# Patient Record
Sex: Female | Born: 1995 | Race: White | Hispanic: No | Marital: Single | Smoking: Never smoker
Health system: Southern US, Community
[De-identification: ages and names within clinical notes are randomized; demographics above are authoritative.]

---

## 2016-04-26 ENCOUNTER — Emergency Department
Admission: EM | Admit: 2016-04-26 | Discharge: 2016-04-26 | Disposition: A | Discharge status: Home / Self Care | Payer: Managed Care, Other (non HMO) | Attending: Emergency Medicine | Admitting: Emergency Medicine

## 2016-04-26 ENCOUNTER — Emergency Department: Payer: Managed Care, Other (non HMO)

## 2016-04-26 DIAGNOSIS — Y929 Unspecified place or not applicable: Secondary | ICD-10-CM | POA: Diagnosis not present

## 2016-04-26 DIAGNOSIS — Y999 Unspecified external cause status: Secondary | ICD-10-CM | POA: Diagnosis not present

## 2016-04-26 DIAGNOSIS — Y939 Activity, unspecified: Secondary | ICD-10-CM | POA: Diagnosis not present

## 2016-04-26 DIAGNOSIS — S99911A Unspecified injury of right ankle, initial encounter: Secondary | ICD-10-CM | POA: Diagnosis present

## 2016-04-26 DIAGNOSIS — S93431A Sprain of tibiofibular ligament of right ankle, initial encounter: Secondary | ICD-10-CM | POA: Diagnosis not present

## 2016-04-26 DIAGNOSIS — W010XXA Fall on same level from slipping, tripping and stumbling without subsequent striking against object, initial encounter: Secondary | ICD-10-CM | POA: Diagnosis not present

## 2016-04-26 DIAGNOSIS — S93491A Sprain of other ligament of right ankle, initial encounter: Secondary | ICD-10-CM

## 2016-04-26 MED ORDER — NAPROXEN 500 MG PO TABS: 500.0000 mg | ORAL_TABLET | Freq: Two times a day (BID) | ORAL | 0 refills | Status: AC

## 2016-04-26 NOTE — ED Notes (Signed)
See triage note  States she fell last Thursday  conts to have pain and swelling to right ankle  Positive pulses

## 2016-04-26 NOTE — ED Triage Notes (Signed)
Pt states she fell down a few steps Thursday continued pain and swelling to right ankle.

## 2016-04-26 NOTE — ED Provider Notes (Signed)
[facility] Emergency Department Provider Note ____________________________________________  Time seen: Approximately 12:47 PM  I have reviewed the triage vital signs and the nursing notes.   HISTORY  Chief Complaint Ankle Pain    HPI Annette Wyatt is a 20 y.o. female who presents to the emergency department for evaluation of right ankle pain. She states that she tripped and fell down a few steps on Thursday.Ankle is still bruised and swollen. She has not taken any medications or applied any type of wrap or splint.  No past medical history on file.  There are no active problems to display for this patient.   No past surgical history on file.  Prior to Admission medications   Medication Sig Start Date End Date Taking? Authorizing Provider  naproxen (NAPROSYN) 500 MG tablet Take 1 tablet (500 mg total) by mouth 2 (two) times daily with a meal. 04/26/16   Chinita Pesterari B Loana Salvaggio, FNP    Allergies Patient has no known allergies.  No family history on file.  Social History Social History  Substance Use Topics  . Smoking status: Not on file  . Smokeless tobacco: Not on file  . Alcohol use Not on file    Review of Systems Constitutional: No recent illness. Cardiovascular: Denies chest pain or palpitations. Respiratory: Denies shortness of breath. Musculoskeletal: Pain in right ankle. Skin: Negative for rash, wound, lesion. Neurological: Negative for focal weakness or numbness.  ____________________________________________   PHYSICAL EXAM:  VITAL SIGNS: ED Triage Vitals  Enc Vitals Group     BP 04/26/16 1239 (!) 134/92     Pulse Rate 04/26/16 1239 84     Resp 04/26/16 1239 20     Temp 04/26/16 1239 98.2 F (36.8 C)     Temp Source 04/26/16 1239 Oral     SpO2 04/26/16 1239 98 %     Weight 04/26/16 1237 145 lb (65.8 kg)     Height 04/26/16 1237 5\' 8"  (1.727 m)     Head Circumference --      Peak Flow --      Pain Score 04/26/16 1237 8      Pain Loc --      Pain Edu? --      Excl. in GC? --     Constitutional: Alert and oriented. Well appearing and in no acute distress. Eyes: Conjunctivae are normal. EOMI. Head: Atraumatic. Neck: No stridor.  Respiratory: Normal respiratory effort.   Musculoskeletal: Right ankle swollen and tender in the ATFL pattern. [place] ankle rules are negative. Neurologic:  Normal speech and language. No gross focal neurologic deficits are appreciated. Speech is normal. No gait instability. Skin:  Skin is warm, dry and intact. Ecchymosis noted on the lateral aspect of the right ankle over the lateral malleolus. Psychiatric: Mood and affect are normal. Speech and behavior are normal.  ____________________________________________   LABS (all labs ordered are listed, but only abnormal results are displayed)  Labs Reviewed - No data to display ____________________________________________  RADIOLOGY  Right ankle negative for acute bony abnormality per radiology. I, Kem Boroughsari Lydia Toren, personally viewed and evaluated these images (plain radiographs) as part of my medical decision making, as well as reviewing the written report by the radiologist.   ____________________________________________   PROCEDURES  Procedure(s) performed: Ankle stirrup splint applied by ER tech. Patient neurovascularly intact post application. Cruches given and training completed.    ____________________________________________   INITIAL IMPRESSION / ASSESSMENT AND PLAN / ED COURSE  Clinical Course     Pertinent  labs & imaging results that were available during my care of the patient were reviewed by me and considered in my medical decision making (see chart for details).  Patient advised to follow up with podiatry for symptoms that are not improving over the week. He was advised to return to the ER for symptoms that change or worsen if unable to see the  specialist. ____________________________________________   FINAL CLINICAL IMPRESSION(S) / ED DIAGNOSES  Final diagnoses:  Sprain of anterior talofibular ligament of right ankle, initial encounter       Chinita PesterCari B Nuri Larmer, FNP 04/26/16 1417    Jene Everyobert Kinner, MD 04/27/16 1704

## 2016-04-26 NOTE — Discharge Instructions (Signed)
Follow up with Dr. Ether GriffinsFowler for symptoms that do not improve over the week.

## 2016-06-05 ENCOUNTER — Emergency Department
Admission: EM | Admit: 2016-06-05 | Discharge: 2016-06-05 | Disposition: A | Discharge status: Home / Self Care | Payer: Managed Care, Other (non HMO) | Attending: Emergency Medicine | Admitting: Emergency Medicine

## 2016-06-05 ENCOUNTER — Encounter: Payer: Self-pay | Admitting: Emergency Medicine

## 2016-06-05 DIAGNOSIS — N939 Abnormal uterine and vaginal bleeding, unspecified: Secondary | ICD-10-CM

## 2016-06-05 DIAGNOSIS — Z791 Long term (current) use of non-steroidal anti-inflammatories (NSAID): Secondary | ICD-10-CM | POA: Diagnosis not present

## 2016-06-05 LAB — PREGNANCY, URINE: Preg Test, Ur: NEGATIVE

## 2016-06-05 LAB — CBC
HCT: 38.7 % (ref 35.0–47.0)
Hemoglobin: 13.5 g/dL (ref 12.0–16.0)
MCH: 30 pg (ref 26.0–34.0)
MCHC: 34.9 g/dL (ref 32.0–36.0)
MCV: 86 fL (ref 80.0–100.0)
PLATELETS: 259 10*3/uL (ref 150–440)
RBC: 4.51 MIL/uL (ref 3.80–5.20)
RDW: 13.2 % (ref 11.5–14.5)
WBC: 4.8 10*3/uL (ref 3.6–11.0)

## 2016-06-05 LAB — URINALYSIS, COMPLETE (UACMP) WITH MICROSCOPIC
BILIRUBIN URINE: NEGATIVE
Bacteria, UA: NONE SEEN
GLUCOSE, UA: NEGATIVE mg/dL
Hgb urine dipstick: NEGATIVE
KETONES UR: 5 mg/dL — AB
LEUKOCYTES UA: NEGATIVE
Nitrite: NEGATIVE
PH: 8 (ref 5.0–8.0)
Protein, ur: 30 mg/dL — AB
Specific Gravity, Urine: 1.023 (ref 1.005–1.030)

## 2016-06-05 LAB — COMPREHENSIVE METABOLIC PANEL
ALK PHOS: 65 U/L (ref 38–126)
ALT: 28 U/L (ref 14–54)
AST: 33 U/L (ref 15–41)
Albumin: 4.7 g/dL (ref 3.5–5.0)
Anion gap: 8 (ref 5–15)
BUN: 11 mg/dL (ref 6–20)
CALCIUM: 9.4 mg/dL (ref 8.9–10.3)
CO2: 26 mmol/L (ref 22–32)
CREATININE: 0.73 mg/dL (ref 0.44–1.00)
Chloride: 105 mmol/L (ref 101–111)
Glucose, Bld: 96 mg/dL (ref 65–99)
Potassium: 3.4 mmol/L — ABNORMAL LOW (ref 3.5–5.1)
Sodium: 139 mmol/L (ref 135–145)
Total Bilirubin: 0.7 mg/dL (ref 0.3–1.2)
Total Protein: 8.4 g/dL — ABNORMAL HIGH (ref 6.5–8.1)

## 2016-06-05 LAB — LIPASE, BLOOD: Lipase: 23 U/L (ref 11–51)

## 2016-06-05 NOTE — ED Triage Notes (Signed)
Pt to ed with c/o vaginal bleeding x 2 months.  Pt also reports weakness and Intermittent vomiting.

## 2016-06-05 NOTE — Discharge Instructions (Signed)
Please call and schedule an appointment at [facility] for the first available appointment that works with your schedule.

## 2016-06-05 NOTE — ED Provider Notes (Signed)
[facility] Emergency Department Provider Note   ____________________________________________    I have reviewed the triage vital signs and the nursing notes.   HISTORY  Chief Complaint Vaginal Bleeding     HPI Annette Wyatt is a 21 y.o. female who presents with 2 months of vaginal bleeding. Patient reports she has essentially been bleeding nearly daily, she denies abdominal pain. No fevers or chills or nausea or vomiting. Typically her period is regular. She has not seen a gynecologist. She also reports some mild weight loss as well. She feels this may be related to stress   History reviewed. No pertinent past medical history.  There are no active problems to display for this patient.   History reviewed. No pertinent surgical history.  Prior to Admission medications   Medication Sig Start Date End Date Taking? Authorizing Provider  naproxen (NAPROSYN) 500 MG tablet Take 1 tablet (500 mg total) by mouth 2 (two) times daily with a meal. 04/26/16   Chinita Pester, FNP     Allergies Patient has no known allergies.  No family history on file.  Social History Social History  Substance Use Topics  . Smoking status: Never Smoker  . Smokeless tobacco: Never Used  . Alcohol use No    Review of Systems  Constitutional: No fever/chills   Gastrointestinal: No abdominal pain.  No nausea, no vomiting.   Genitourinary: Negative for dysuria.As above Musculoskeletal: Negative for back pain. Skin: Negative for rash.     ____________________________________________   PHYSICAL EXAM:  VITAL SIGNS: ED Triage Vitals [06/05/16 1402]  Enc Vitals Group     BP 139/84     Pulse Rate 84     Resp 20     Temp 98.2 F (36.8 C)     Temp Source Oral     SpO2 100 %     Weight 141 lb 11.2 oz (64.3 kg)     Height      Head Circumference      Peak Flow      Pain Score 0     Pain Loc      Pain Edu?      Excl. in GC?     Constitutional: Alert  and oriented. No acute distress. Pleasant and interactive Eyes: Conjunctivae are normal. No pallor   Mouth/Throat: Mucous membranes are moist.    Cardiovascular: Normal rate, regular rhythm. Grossly normal heart sounds.  Good peripheral circulation. Respiratory: Normal respiratory effort.  No retractions. Lungs CTAB. Gastrointestinal: Soft and nontender. No distention.  No CVA tenderness. Genitourinary: deferred Musculoskeletal:   Warm and well perfused Neurologic:  Normal speech and language. No gross focal neurologic deficits are appreciated.  Skin:  Skin is warm, dry and intact. No rash noted. Psychiatric: Mood and affect are normal. Speech and behavior are normal.  ____________________________________________   LABS (all labs ordered are listed, but only abnormal results are displayed)  Labs Reviewed  COMPREHENSIVE METABOLIC PANEL - Abnormal; Notable for the following:       Result Value   Potassium 3.4 (*)    Total Protein 8.4 (*)    All other components within normal limits  URINALYSIS, COMPLETE (UACMP) WITH MICROSCOPIC - Abnormal; Notable for the following:    Color, Urine YELLOW (*)    APPearance CLEAR (*)    Ketones, ur 5 (*)    Protein, ur 30 (*)    Squamous Epithelial / LPF 0-5 (*)    All other components within normal limits  LIPASE, BLOOD  CBC  PREGNANCY, URINE   ____________________________________________  EKG  None ____________________________________________  RADIOLOGY  None ____________________________________________   PROCEDURES  Procedure(s) performed: No    Critical Care performed: No ____________________________________________   INITIAL IMPRESSION / ASSESSMENT AND PLAN / ED COURSE  Pertinent labs & imaging results that were available during my care of the patient were reviewed by me and considered in my medical decision making (see chart for details).  Patient well-appearing and in no acute distress. Vital signs unremarkable.  Lab work reassuring, hemoglobin 13.5. Pregnancy test negative. She appears to be having abnormal uterine bleeding of unknown origin, she will require GYN follow-up. Discussed this with her and have referred her to [facility] for further management.  Clinical Course    ____________________________________________   FINAL CLINICAL IMPRESSION(S) / ED DIAGNOSES  Final diagnoses:  Abnormal uterine bleeding (AUB)      NEW MEDICATIONS STARTED DURING THIS VISIT:  Discharge Medication List as of 06/05/2016  3:57 PM       Note:  This document was prepared using Dragon voice recognition software and may include unintentional dictation errors.    Jene Everyobert Karlyn Glasco, MD 06/05/16 [phone]

## 2016-06-23 ENCOUNTER — Emergency Department: Payer: Managed Care, Other (non HMO)

## 2016-06-23 ENCOUNTER — Encounter: Payer: Self-pay | Admitting: Emergency Medicine

## 2016-06-23 ENCOUNTER — Emergency Department
Admission: EM | Admit: 2016-06-23 | Discharge: 2016-06-23 | Disposition: A | Discharge status: Home / Self Care | Payer: Managed Care, Other (non HMO) | Attending: Emergency Medicine | Admitting: Emergency Medicine

## 2016-06-23 DIAGNOSIS — K661 Hemoperitoneum: Secondary | ICD-10-CM | POA: Insufficient documentation

## 2016-06-23 DIAGNOSIS — N83201 Unspecified ovarian cyst, right side: Secondary | ICD-10-CM | POA: Diagnosis not present

## 2016-06-23 DIAGNOSIS — N83209 Unspecified ovarian cyst, unspecified side: Secondary | ICD-10-CM

## 2016-06-23 DIAGNOSIS — R102 Pelvic and perineal pain: Secondary | ICD-10-CM

## 2016-06-23 DIAGNOSIS — R1031 Right lower quadrant pain: Secondary | ICD-10-CM | POA: Diagnosis present

## 2016-06-23 LAB — COMPREHENSIVE METABOLIC PANEL
ALBUMIN: 4.1 g/dL (ref 3.5–5.0)
ALT: 23 U/L (ref 14–54)
ANION GAP: 5 (ref 5–15)
AST: 23 U/L (ref 15–41)
Alkaline Phosphatase: 56 U/L (ref 38–126)
BUN: 12 mg/dL (ref 6–20)
CHLORIDE: 109 mmol/L (ref 101–111)
CO2: 25 mmol/L (ref 22–32)
Calcium: 9.1 mg/dL (ref 8.9–10.3)
Creatinine, Ser: 0.69 mg/dL (ref 0.44–1.00)
GFR calc Af Amer: >60 mL/min (ref >60)
GFR calc non Af Amer: >60 mL/min (ref >60)
GLUCOSE: 113 mg/dL — AB (ref 65–99)
POTASSIUM: 3.8 mmol/L (ref 3.5–5.1)
Sodium: 139 mmol/L (ref 135–145)
Total Bilirubin: 1 mg/dL (ref 0.3–1.2)
Total Protein: 7.6 g/dL (ref 6.5–8.1)

## 2016-06-23 LAB — URINALYSIS, COMPLETE (UACMP) WITH MICROSCOPIC
BILIRUBIN URINE: NEGATIVE
Glucose, UA: NEGATIVE mg/dL
KETONES UR: 5 mg/dL — AB
Nitrite: NEGATIVE
PROTEIN: 30 mg/dL — AB
Specific Gravity, Urine: 1.02 (ref 1.005–1.030)
pH: 6 (ref 5.0–8.0)

## 2016-06-23 LAB — CBC WITH DIFFERENTIAL/PLATELET
BASOS ABS: 0 10*3/uL (ref 0–0.1)
BASOS PCT: 0 %
Eosinophils Absolute: 0 10*3/uL (ref 0–0.7)
Eosinophils Relative: 0 %
HEMATOCRIT: 32.5 % — AB (ref 35.0–47.0)
HEMOGLOBIN: 11 g/dL — AB (ref 12.0–16.0)
Lymphocytes Relative: 7 %
Lymphs Abs: 0.9 10*3/uL — ABNORMAL LOW (ref 1.0–3.6)
MCH: 29.4 pg (ref 26.0–34.0)
MCHC: 33.8 g/dL (ref 32.0–36.0)
MCV: 87 fL (ref 80.0–100.0)
Monocytes Absolute: 0.7 10*3/uL (ref 0.2–0.9)
Monocytes Relative: 5 %
NEUTROS ABS: 10.9 10*3/uL — AB (ref 1.4–6.5)
NEUTROS PCT: 88 %
Platelets: 307 10*3/uL (ref 150–440)
RBC: 3.74 MIL/uL — ABNORMAL LOW (ref 3.80–5.20)
RDW: 13.9 % (ref 11.5–14.5)
WBC: 12.4 10*3/uL — ABNORMAL HIGH (ref 3.6–11.0)

## 2016-06-23 LAB — CBC
HEMATOCRIT: 36.1 % (ref 35.0–47.0)
HEMOGLOBIN: 12.5 g/dL (ref 12.0–16.0)
MCH: 30 pg (ref 26.0–34.0)
MCHC: 34.6 g/dL (ref 32.0–36.0)
MCV: 86.8 fL (ref 80.0–100.0)
Platelets: 322 10*3/uL (ref 150–440)
RBC: 4.15 MIL/uL (ref 3.80–5.20)
RDW: 13.9 % (ref 11.5–14.5)
WBC: 8 10*3/uL (ref 3.6–11.0)

## 2016-06-23 LAB — TYPE AND SCREEN
ABO/RH(D): A NEG
ANTIBODY SCREEN: NEGATIVE

## 2016-06-23 LAB — POCT PREGNANCY, URINE: Preg Test, Ur: NEGATIVE

## 2016-06-23 LAB — LIPASE, BLOOD: LIPASE: 22 U/L (ref 11–51)

## 2016-06-23 MED ORDER — ONDANSETRON HCL 4 MG/2ML IJ SOLN
4.0000 mg | Freq: Once | INTRAMUSCULAR | Status: AC
  Administered 2016-06-23: 4 mg via INTRAVENOUS
  Filled 2016-06-23: qty 2

## 2016-06-23 MED ORDER — OXYCODONE-ACETAMINOPHEN 5-325 MG PO TABS
2.0000 | ORAL_TABLET | Freq: Once | ORAL | Status: AC
  Administered 2016-06-23: 2 via ORAL
  Filled 2016-06-23: qty 2

## 2016-06-23 MED ORDER — MORPHINE SULFATE (PF) 4 MG/ML IV SOLN
4.0000 mg | Freq: Once | INTRAVENOUS | Status: AC
  Administered 2016-06-23: 4 mg via INTRAVENOUS
  Filled 2016-06-23: qty 1

## 2016-06-23 MED ORDER — SODIUM CHLORIDE 0.9 % IV SOLN
Freq: Once | INTRAVENOUS | Status: AC
  Administered 2016-06-23: 13:00:00 via INTRAVENOUS

## 2016-06-23 MED ORDER — OXYCODONE-ACETAMINOPHEN 5-325 MG PO TABS: 2.0000 | ORAL_TABLET | Freq: Four times a day (QID) | ORAL | 0 refills | Status: AC | PRN

## 2016-06-23 MED ORDER — SODIUM CHLORIDE 0.9 % IV SOLN
Freq: Once | INTRAVENOUS | Status: AC
  Administered 2016-06-23: 10:00:00 via INTRAVENOUS

## 2016-06-23 MED ORDER — NORGESTIM-ETH ESTRAD TRIPHASIC 0.18/0.215/0.25 MG-25 MCG PO TABS: 1.0000 | ORAL_TABLET | Freq: Every day | ORAL | 11 refills | Status: AC

## 2016-06-23 NOTE — ED Provider Notes (Signed)
Patient is agreeable to going home, she is advised to return for worsening or worrisome symptoms. She will follow-up with Dr. Bonney AidStaebler as an outpatient   Annette FilbertJonathan E Kodie Kishi, MD 06/23/16 1444

## 2016-06-23 NOTE — ED Provider Notes (Signed)
[facility] Emergency Department Provider Note        Time seen: ----------------------------------------- 10:02 AM on 06/23/2016 -----------------------------------------    I have reviewed the triage vital signs and the nursing notes.   HISTORY  Chief Complaint Abdominal Pain    HPI Annette Wyatt is a 21 y.o. female who presents the ER for right lower quadrant pain that started yesterday. Pain is sharp and crampy at 910. She has had some nausea and any changes in her bowel or bladder habits. Last menstrual cycle was 2 weeks ago.Patient has not had these symptoms before, she does not have any vaginal discharge or concerns for STD. Patient states she had vomiting on arrival here, has not had any diarrhea.   History reviewed. No pertinent past medical history.  There are no active problems to display for this patient.   History reviewed. No pertinent surgical history.  Allergies Patient has no known allergies.  Social History Social History  Substance Use Topics  . Smoking status: Never Smoker  . Smokeless tobacco: Never Used  . Alcohol use Yes    Review of Systems Constitutional: Negative for fever. Cardiovascular: Negative for chest pain. Respiratory: Negative for shortness of breath. Gastrointestinal:Positive for abdominal pain, vomiting Genitourinary: Negative for dysuria. Musculoskeletal: Negative for back pain. Skin: Negative for rash. Neurological: Negative for headaches, focal weakness or numbness.  10-point ROS otherwise negative.  ____________________________________________   PHYSICAL EXAM:  VITAL SIGNS: ED Triage Vitals  Enc Vitals Group     BP 06/23/16 0939 (!) 119/103     Pulse Rate 06/23/16 0939 (!) 101     Resp 06/23/16 0939 18     Temp 06/23/16 0939 97.7 F (36.5 C)     Temp Source 06/23/16 0939 Oral     SpO2 06/23/16 0939 100 %     Wyatt 06/23/16 0939 140 lb (63.5 kg)     Height 06/23/16 0939 5\' 8"  (1.727  m)     Head Circumference --      Peak Flow --      Pain Score 06/23/16 0952 9     Pain Loc --      Pain Edu? --      Excl. in GC? --     Constitutional: Alert and oriented. Well appearing and in no distress. Eyes: Conjunctivae are normal. PERRL. Normal extraocular movements. ENT   Head: Normocephalic and atraumatic.   Nose: No congestion/rhinnorhea.   Mouth/Throat: Mucous membranes are moist.   Neck: No stridor. Cardiovascular: Normal rate, regular rhythm. No murmurs, rubs, or gallops. Respiratory: Normal respiratory effort without tachypnea nor retractions. Breath sounds are clear and equal bilaterally. No wheezes/rales/rhonchi. Gastrointestinal: Right lower quadrant tenderness, no rebound or guarding. Normal bowel sounds. Musculoskeletal: Nontender with normal range of motion in all extremities. No lower extremity tenderness nor edema. Neurologic:  Normal speech and language. No gross focal neurologic deficits are appreciated.  Skin:  Skin is warm, dry and intact. No rash noted. Psychiatric: Mood and affect are normal. Speech and behavior are normal.  ____________________________________________  ED COURSE:  Pertinent labs & imaging results that were available during my care of the patient were reviewed by me and considered in my medical decision making (see chart for details). Patient presents to the ER with right lower quadrant pain and vomiting. We will assess with labs, ultrasound and possibly CT imaging   Procedures ____________________________________________   LABS (pertinent positives/negatives)  Labs Reviewed  COMPREHENSIVE METABOLIC PANEL - Abnormal; Notable for the following:  Result Value   Glucose, Bld 113 (*)    All other components within normal limits  URINALYSIS, COMPLETE (UACMP) WITH MICROSCOPIC - Abnormal; Notable for the following:    Color, Urine YELLOW (*)    APPearance HAZY (*)    Hgb urine dipstick SMALL (*)    Ketones, ur 5 (*)     Protein, ur 30 (*)    Leukocytes, UA TRACE (*)    Bacteria, UA RARE (*)    Squamous Epithelial / LPF 6-30 (*)    All other components within normal limits  CBC WITH DIFFERENTIAL/PLATELET - Abnormal; Notable for the following:    WBC 12.4 (*)    RBC 3.74 (*)    Hemoglobin 11.0 (*)    HCT 32.5 (*)    Neutro Abs 10.9 (*)    Lymphs Abs 0.9 (*)    All other components within normal limits  CBC  LIPASE, BLOOD  POC URINE PREG, ED  POCT PREGNANCY, URINE  TYPE AND SCREEN    RADIOLOGY Images were viewed by me CT renal protocol IMPRESSION: Hemorrhage in the abdomen and pelvis is likely related to rupture of a hemorrhagic ovarian cyst although the source cannot be definitively localized. Active bleeding cannot be excluded. Pelvic ultrasound could be used for further evaluation.  Critical Value/emergent results were called by telephone at the time of interpretation on 06/23/2016 at 12:28 pm to Dr. Daryel NovemberJONATHAN Dmiya Malphrus , who verbally acknowledged these results.   Pelvic ultrasound  IMPRESSION: 1. Right ovary is enlarged by 3 discrete cysts, largest measuring 3.1 cm. There is moderate complex pelvic free fluid consistent hemoperitoneum. Findings may be from rupture of a hemorrhagic ovarian cyst. 2. No other abnormalities. Normal uterus and left ovary. No adnexal masses. No ovarian torsion. ____________________________________________  FINAL ASSESSMENT AND PLAN  Abdominal pain, ruptured ovarian cyst  Plan: Patient with labs and imaging as dictated above. Patient presented to the ER with acute right lower quadrant pain found to be from ruptured hemorrhagic ovarian cyst. She does have hemoperitoneum but her exam is relatively benign. Her vital signs are normal and I have discussed the case at length with Dr. Bonney AidStaebler. He states a lesser pain was out of control or her hemoglobin drops below 10 he would recommend outpatient follow-up. Typically there is symptomatic treatment at this  point.   Annette Wyatt, [name] E, MD   Note: This note was generated in part or whole with voice recognition software. Voice recognition is usually quite accurate but there are transcription errors that can and very often do occur. I apologize for any typographical errors that were not detected and corrected.     Aliah FilbertJonathan E Gillis Boardley, MD 06/23/16 1435

## 2016-06-23 NOTE — ED Triage Notes (Signed)
Pt ems from [facility] for RLQ pain that started yesterday. Pain sharp, cramp 9/10. Pt has nausea, denies changes in bowel or bladder habits. Last menstrual cycle 2 weeks ago.

## 2017-09-20 IMAGING — CT CT RENAL STONE PROTOCOL
3 of 4 series · 10 of 46 positions shown, 15 images · non-contrast
Comparison: None.

CLINICAL DATA: Right lower quadrant pain and cramping since
yesterday P

EXAM:
CT ABDOMEN AND PELVIS WITHOUT CONTRAST
TECHNIQUE: Multidetector CT imaging of the abdomen and pelvis was performed
following the standard protocol without IV contrast.

[Series 4: lung bases · axial · 0.69mm/px · z∈[-750,-650]mm · 6 of 29 slices shown, 11 images]
[im 5/29  soft-tissue]
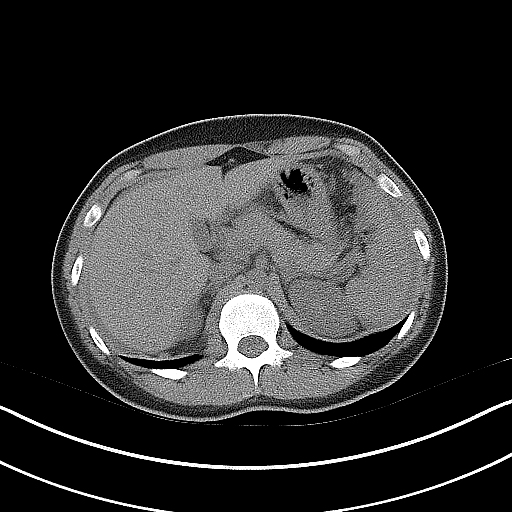
[im 5/29  bone]
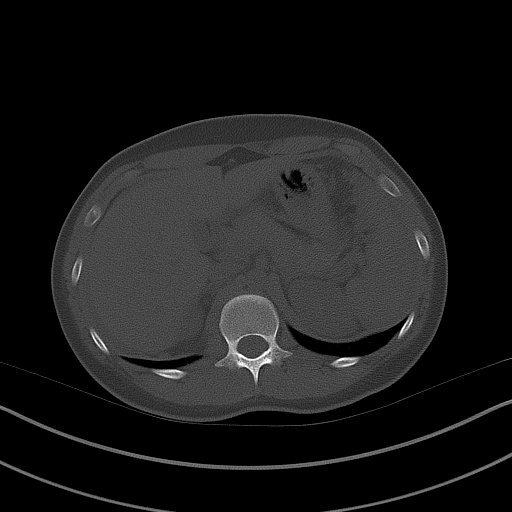
[im 9/29  soft-tissue]
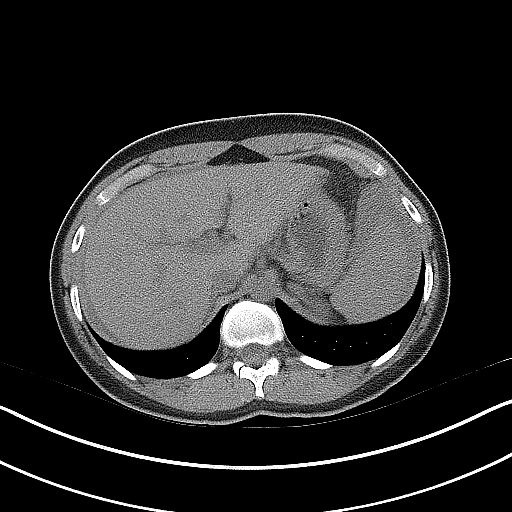
[im 13/29  soft-tissue]
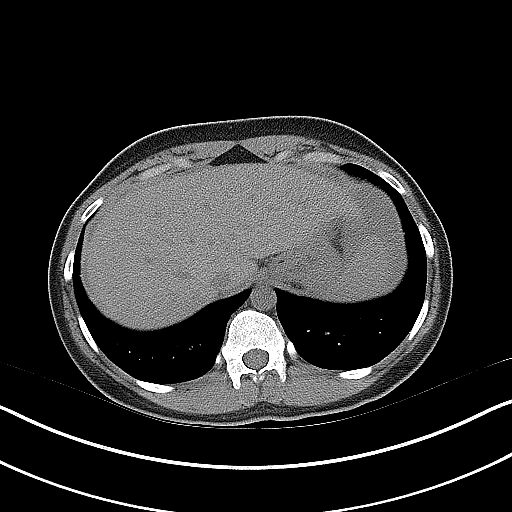
[im 13/29  lung]
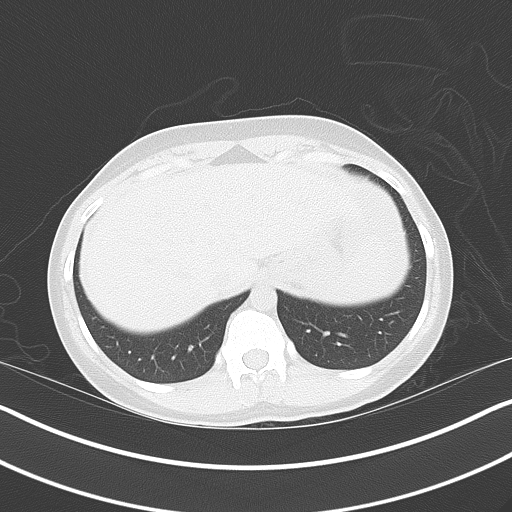
[im 17/29  soft-tissue]
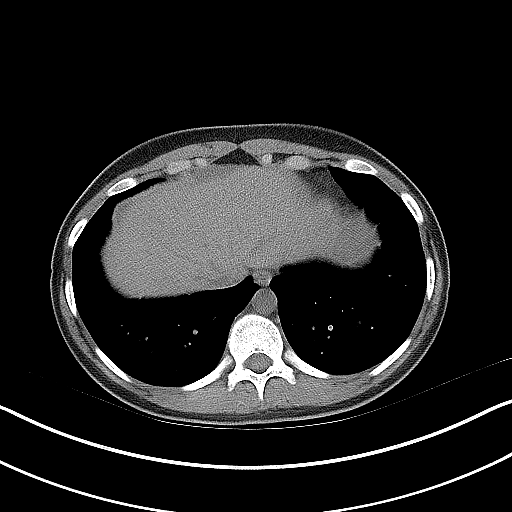
[im 17/29  lung]
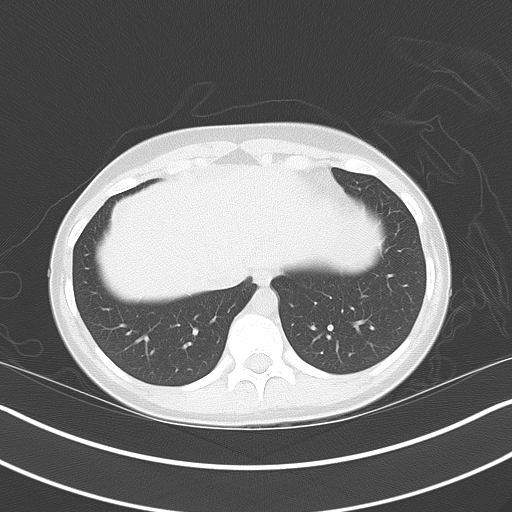
[im 21/29  soft-tissue]
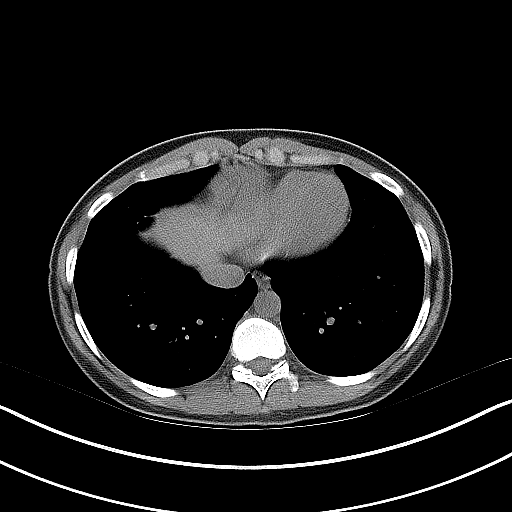
[im 21/29  lung]
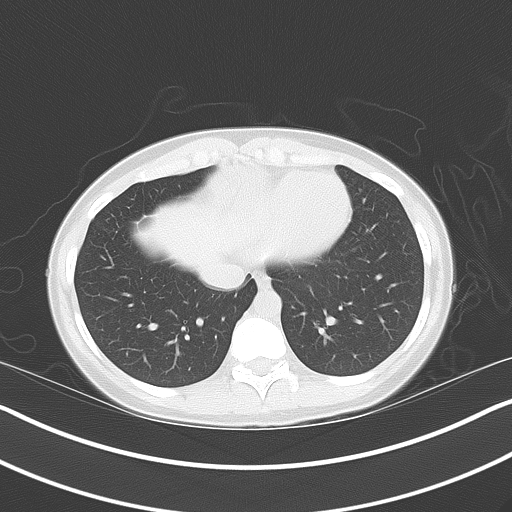
[im 25/29  soft-tissue]
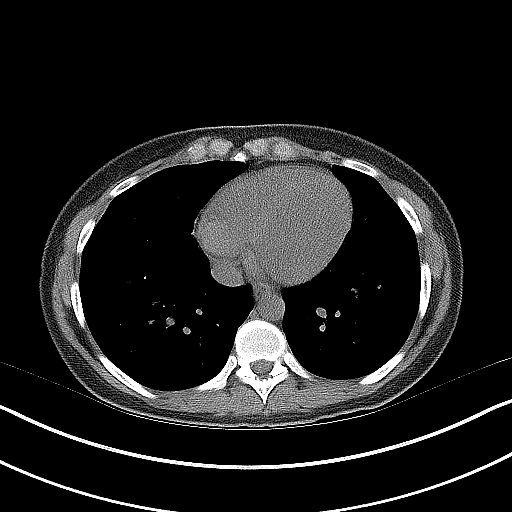
[im 25/29  lung]
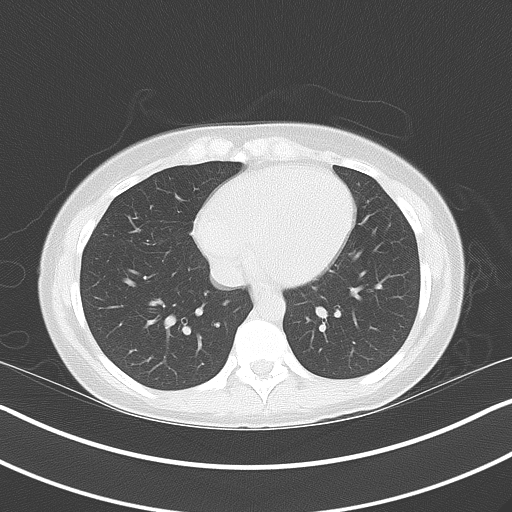

[Series 5: coronal · coronal · 0.67mm/px · 3 of 109 slices shown]
[im 37/109  soft-tissue]
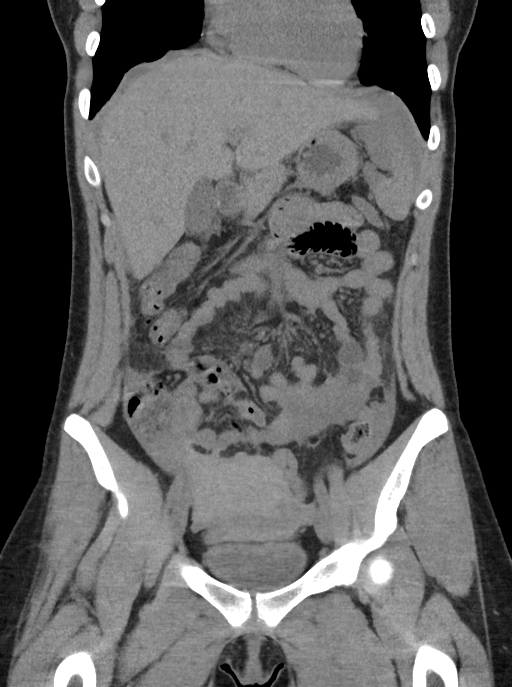
[im 49/109  soft-tissue]
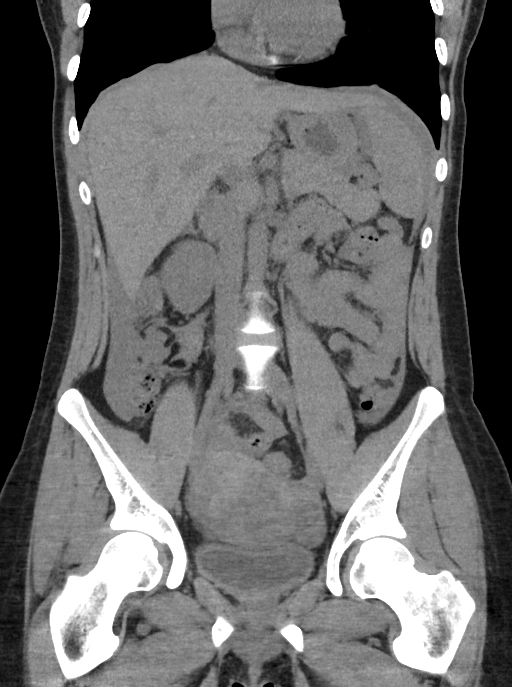
[im 61/109  soft-tissue]
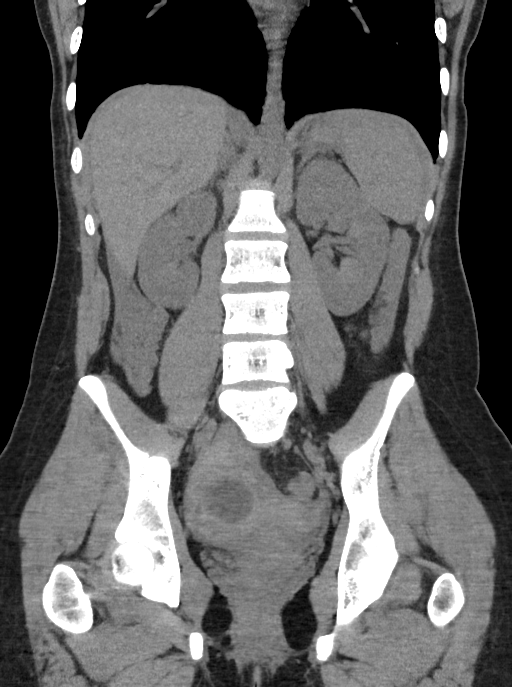

[Series 6: sagittal · sagittal · 0.57mm/px · 1 of 170 slices shown]
[im 57/170  soft-tissue]
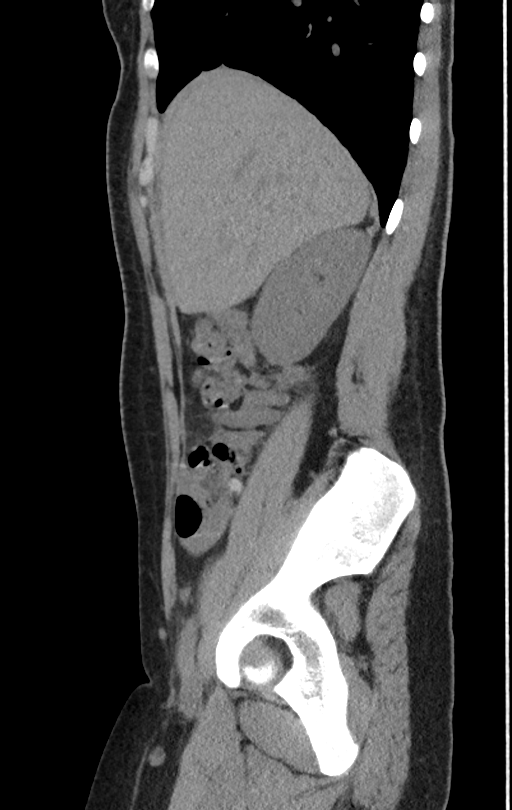

[10 of 46 positions shown; findings below may reference images not displayed]

FINDINGS: Lower chest: Lung bases are clear. No pleural or pericardial
effusion.

Hepatobiliary: No focal liver abnormality is seen. No gallstones,
gallbladder wall thickening, or biliary dilatation.

Pancreas: Unremarkable. No pancreatic ductal dilatation or
surrounding inflammatory changes.

Spleen: Normal in size without focal abnormality.

Adrenals/Urinary Tract: Adrenal glands are unremarkable. Kidneys are
normal, without renal calculi, focal lesion, or hydronephrosis.
Bladder is unremarkable.

Stomach/Bowel: Stomach is within normal limits. Appendix appears
normal. No evidence of bowel wall thickening, distention, or
inflammatory changes.

Vascular/Lymphatic: No significant vascular findings are present. No
enlarged abdominal or pelvic lymph nodes.

Reproductive: The patient has a cystic lesion the right ovary
measuring 3.3 x 3.3 x 2.5 cm. An adjacent smaller cystic lesion is
also identified. There is hemorrhage in the abdomen and pelvis
extending superiorly about the spleen and liver. Left ovary and
uterus are unremarkable.

Other: None.

Musculoskeletal: No acute bony abnormality.
IMPRESSION: Hemorrhage in the abdomen and pelvis is likely related to rupture of
a hemorrhagic ovarian cyst although the source cannot be
definitively localized. Active bleeding cannot be excluded. Pelvic
ultrasound could be used for further evaluation.

Critical Value/emergent results were called by telephone at the time
of interpretation on 06/23/2016 at [DATE] to Dr. BLAIN JUMPER
, who verbally acknowledged these results.

## 2018-06-17 IMAGING — US US PELVIS COMPLETE
2 series · 13 of 25 positions shown · non-contrast
Comparison: Current abdomen pelvis CT.

CLINICAL DATA: Pelvic pain. Pain described as right lower quadrant
pain for 24 hours.

EXAM:
TRANSABDOMINAL AND TRANSVAGINAL ULTRASOUND OF PELVIS
DOPPLER ULTRASOUND OF OVARIES
TECHNIQUE: Both transabdominal and transvaginal ultrasound examinations of the
pelvis were performed. Transabdominal technique was performed for
global imaging of the pelvis including uterus, ovaries, adnexal
regions, and pelvic cul-de-sac.
It was necessary to proceed with endovaginal exam following the
transabdominal exam to visualize the uterus, endometrium and ovaries
to better advantage.. Color and duplex Doppler ultrasound was
utilized to evaluate blood flow to the ovaries.

[Series 1: us pelvis complete · 0.24mm/px · 12 of 94 slices shown (1 of 2)]
[im 1/94]
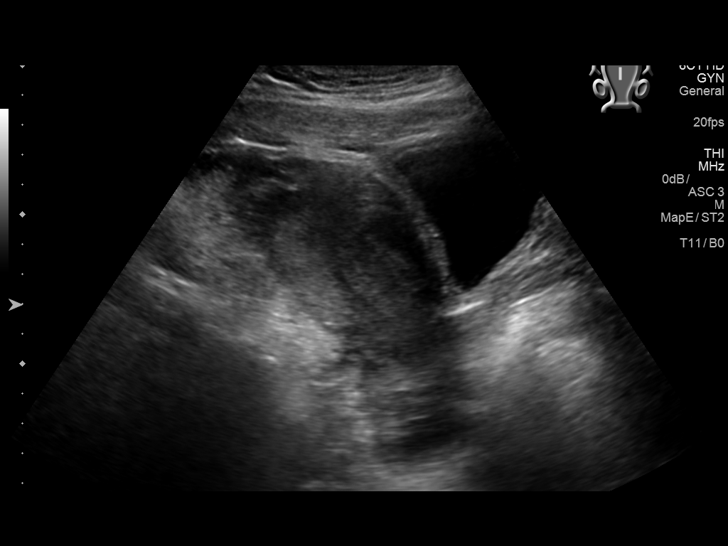
[im 9/94]
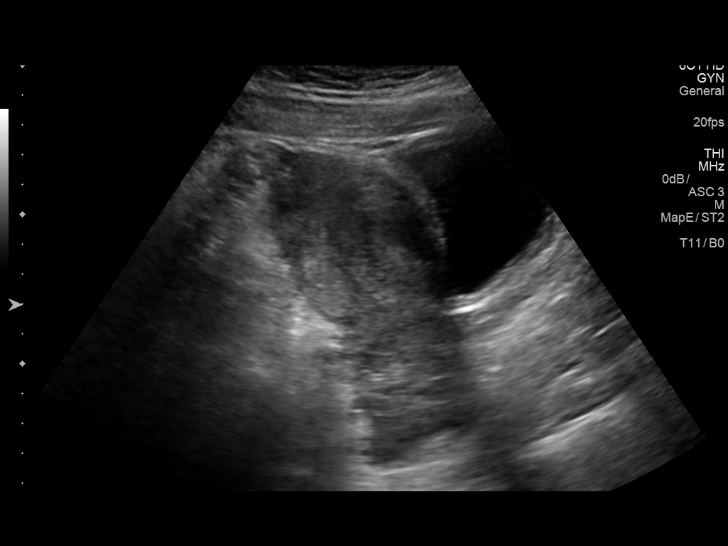
[im 17/94]
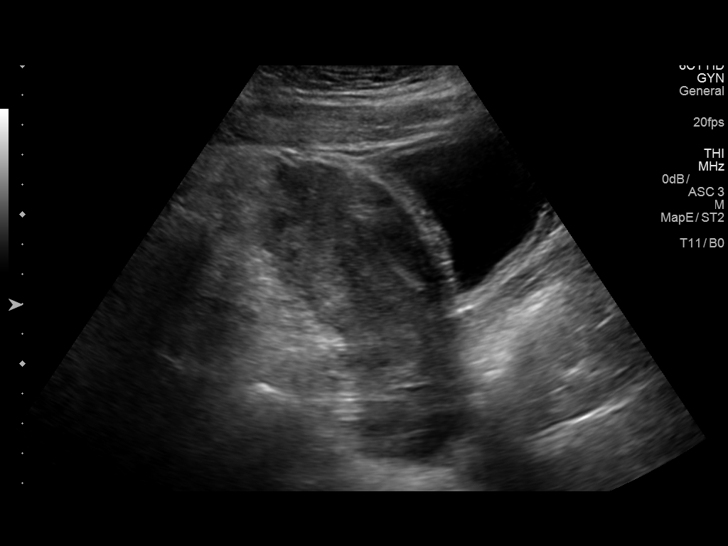
[im 25/94]
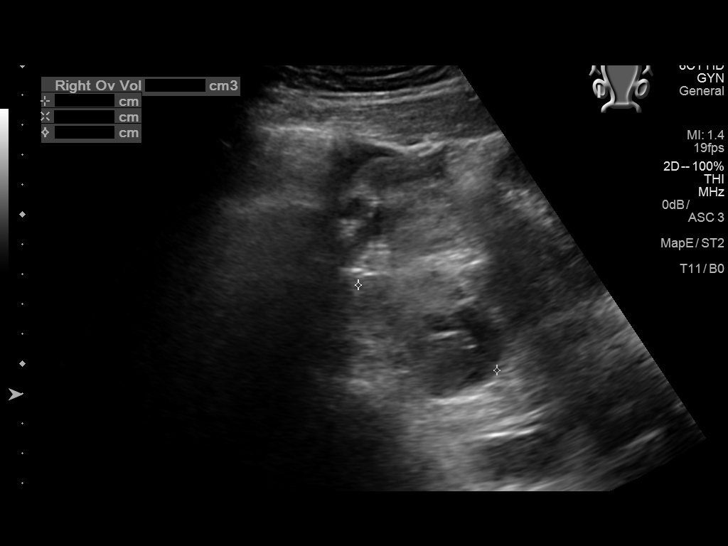
[im 33/94]
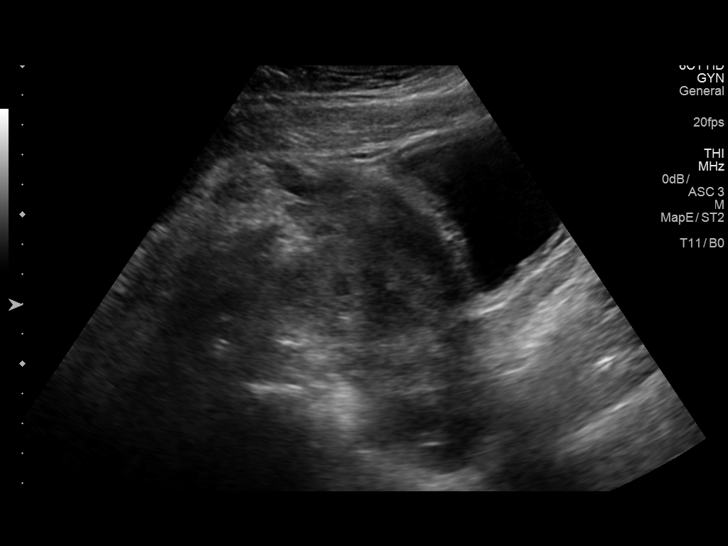
[im 41/94]
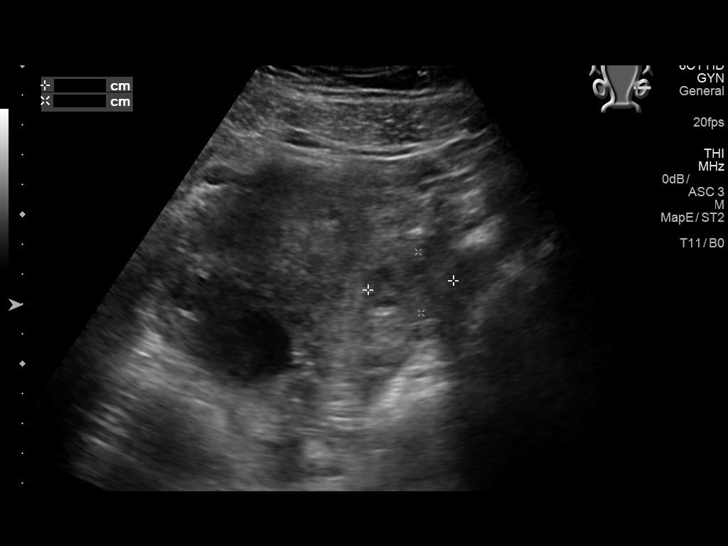
[im 49/94]
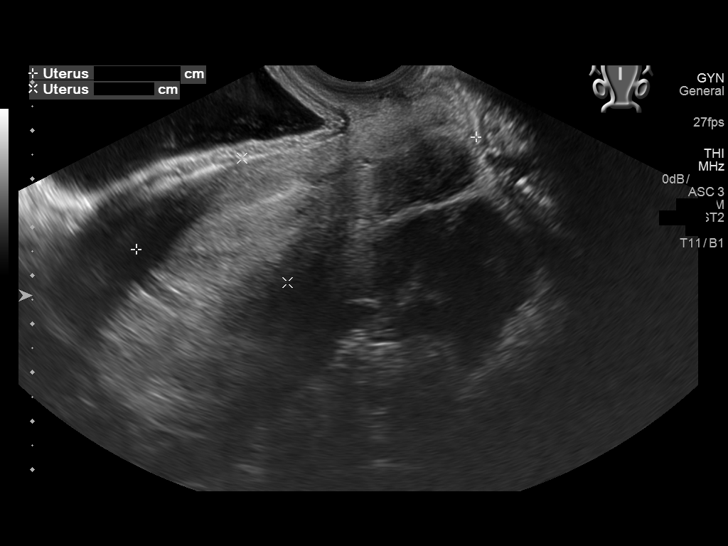
[im 57/94]
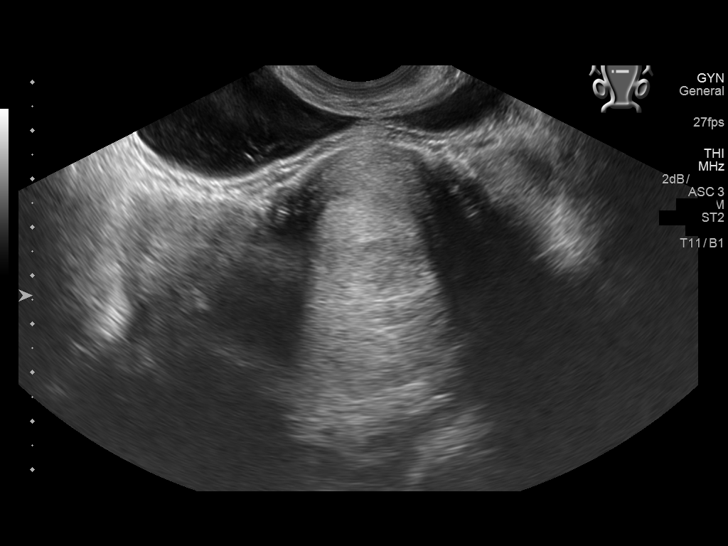
[im 65/94]
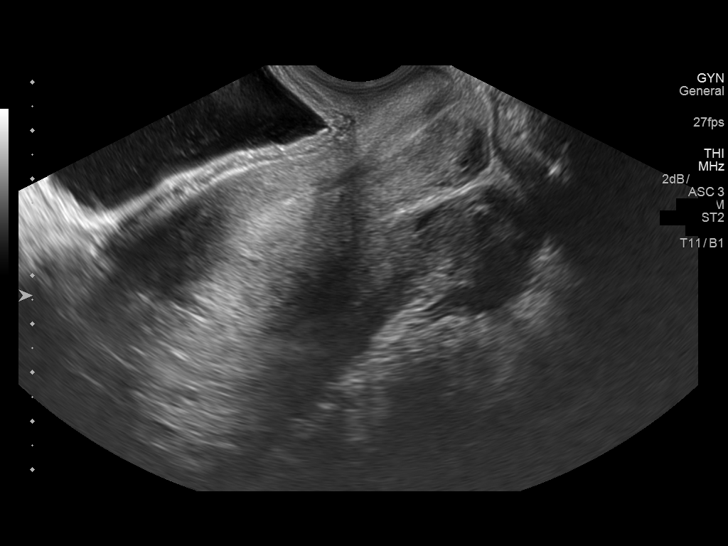
[im 73/94]
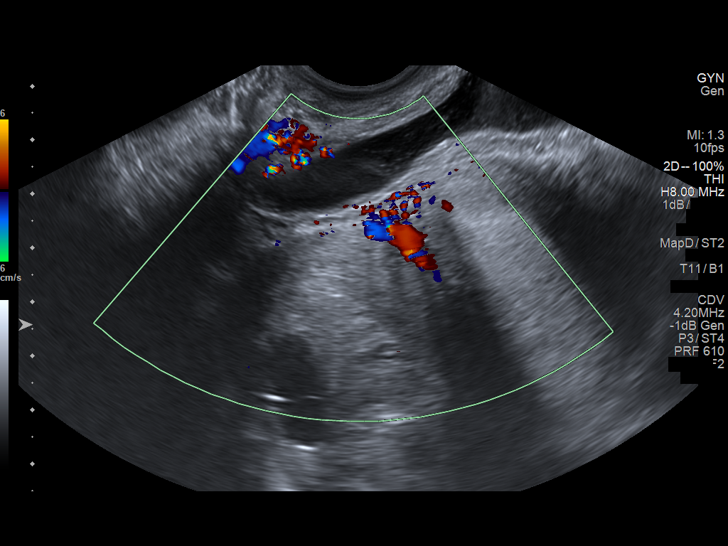
[im 81/94]
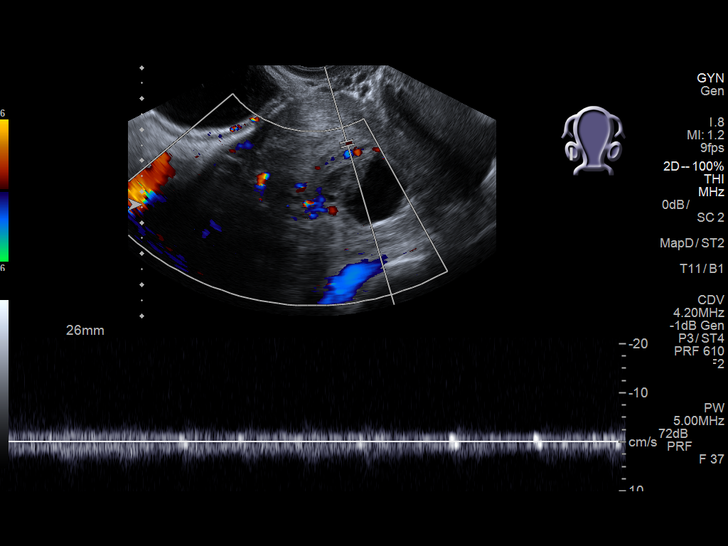
[im 89/94]
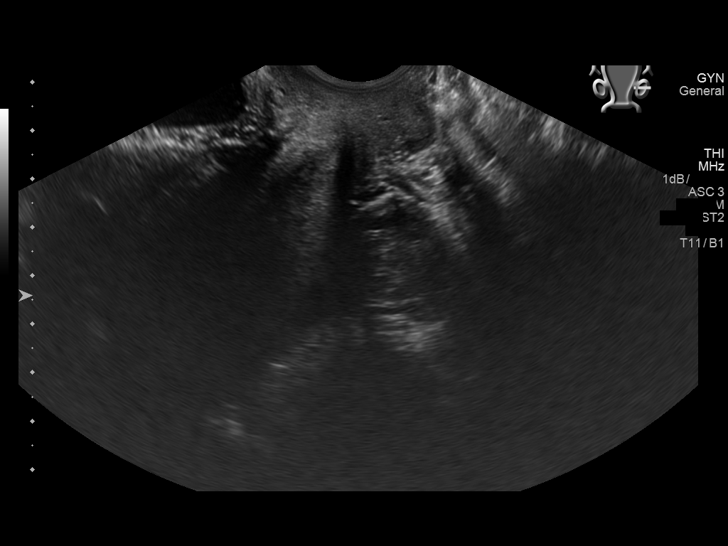

[Series 1001: us pelvis complete · 0.15mm/px · 1 of 3 slices shown (2 of 2)]
[im 1/3]
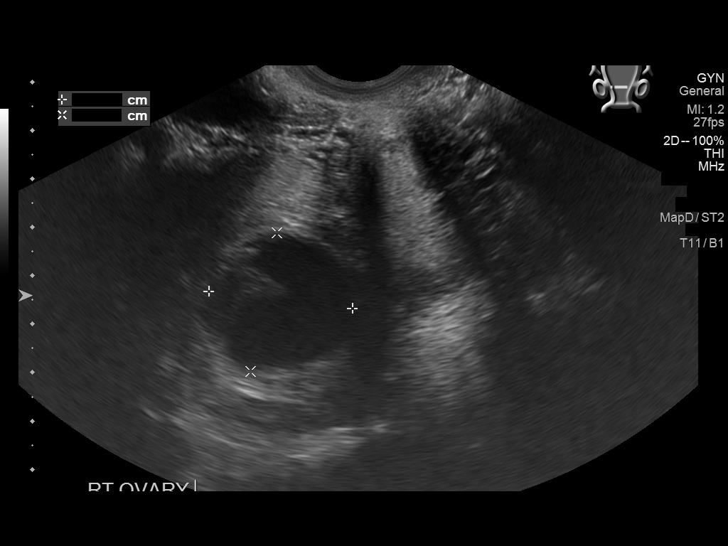

[13 of 25 positions shown; findings below may reference images not displayed]

FINDINGS: Uterus

Measurements: 7.4 x 3.0 x 3.2 cm. No fibroids or other mass
visualized.

Endometrium

Thickness: 7 mm.  No focal abnormality visualized.

Right ovary

Measurements: There are 3 discrete cysts enlarging the right ovary.
Right ovary measures 5.4 x 3.8 x 5.0 cm. Largest cyst measures 3.1 x
2.3 x 3.0 cm. No other ovarian abnormality. No adnexal masses.

Left ovary

Measurements: 2.3 x 1.4 x 2.6 cm. Normal appearance/no adnexal mass.

Pulsed Doppler evaluation of both ovaries demonstrates normal
low-resistance arterial and venous waveforms.

Other findings

Moderate fluid with echogenic debris consistent with hemoperitoneum
as there are on the current abdomen pelvis CT.
IMPRESSION: 1. Right ovary is enlarged by 3 discrete cysts, largest measuring
3.1 cm. There is moderate complex pelvic free fluid consistent
hemoperitoneum. Findings may be from rupture of a hemorrhagic
ovarian cyst.
2. No other abnormalities. Normal uterus and left ovary. No adnexal
masses. No ovarian torsion.
